# Patient Record
Sex: Male | Born: 1960 | Race: Black or African American | Hispanic: No | Marital: Single | Smoking: Never smoker
Health system: Southern US, Community
[De-identification: ages and names within clinical notes are randomized; demographics above are authoritative.]

## PROBLEM LIST (undated history)

## (undated) DIAGNOSIS — B20 Human immunodeficiency virus [HIV] disease: Secondary | ICD-10-CM

---

## 2004-08-19 ENCOUNTER — Emergency Department (HOSPITAL_COMMUNITY): Admission: EM | Admit: 2004-08-19 | Discharge: 2004-08-19 | Payer: Self-pay | Admitting: Emergency Medicine

## 2004-09-02 ENCOUNTER — Encounter: Admission: RE | Admit: 2004-09-02 | Discharge: 2004-09-02 | Payer: Self-pay | Admitting: Neurosurgery

## 2011-12-25 ENCOUNTER — Emergency Department (HOSPITAL_COMMUNITY): Payer: No Typology Code available for payment source

## 2011-12-25 ENCOUNTER — Emergency Department (HOSPITAL_COMMUNITY)
Admission: EM | Admit: 2011-12-25 | Discharge: 2011-12-25 | Disposition: A | Discharge status: Home / Self Care | Payer: No Typology Code available for payment source | Attending: Emergency Medicine | Admitting: Emergency Medicine

## 2011-12-25 ENCOUNTER — Encounter (HOSPITAL_COMMUNITY): Payer: Self-pay | Admitting: Emergency Medicine

## 2011-12-25 DIAGNOSIS — Y92410 Unspecified street and highway as the place of occurrence of the external cause: Secondary | ICD-10-CM | POA: Insufficient documentation

## 2011-12-25 DIAGNOSIS — M549 Dorsalgia, unspecified: Secondary | ICD-10-CM | POA: Insufficient documentation

## 2011-12-25 DIAGNOSIS — Z21 Asymptomatic human immunodeficiency virus [HIV] infection status: Secondary | ICD-10-CM | POA: Insufficient documentation

## 2011-12-25 DIAGNOSIS — S161XXA Strain of muscle, fascia and tendon at neck level, initial encounter: Secondary | ICD-10-CM

## 2011-12-25 DIAGNOSIS — S139XXA Sprain of joints and ligaments of unspecified parts of neck, initial encounter: Secondary | ICD-10-CM | POA: Insufficient documentation

## 2011-12-25 DIAGNOSIS — Y998 Other external cause status: Secondary | ICD-10-CM | POA: Insufficient documentation

## 2011-12-25 HISTORY — DX: Human immunodeficiency virus (HIV) disease: B20

## 2011-12-25 MED ORDER — CYCLOBENZAPRINE HCL 10 MG PO TABS: 10.0000 mg | ORAL_TABLET | Freq: Three times a day (TID) | ORAL | Status: AC | PRN

## 2011-12-25 MED ORDER — CYCLOBENZAPRINE HCL 10 MG PO TABS
10.0000 mg | ORAL_TABLET | Freq: Once | ORAL | Status: AC
  Administered 2011-12-25: 10 mg via ORAL
  Filled 2011-12-25: qty 1

## 2011-12-25 MED ORDER — HYDROCODONE-ACETAMINOPHEN 5-325 MG PO TABS: 1.0000 | ORAL_TABLET | ORAL | Status: AC | PRN

## 2011-12-25 MED ORDER — ACETAMINOPHEN 325 MG PO TABS
650.0000 mg | ORAL_TABLET | Freq: Once | ORAL | Status: AC
  Administered 2011-12-25: 650 mg via ORAL
  Filled 2011-12-25: qty 2

## 2011-12-25 MED ORDER — HYDROCODONE-ACETAMINOPHEN 5-325 MG PO TABS
1.0000 | ORAL_TABLET | Freq: Once | ORAL | Status: AC
  Administered 2011-12-25: 1 via ORAL
  Filled 2011-12-25: qty 1
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
## 2011-12-31 ENCOUNTER — Emergency Department (HOSPITAL_COMMUNITY)
Admission: EM | Admit: 2011-12-31 | Discharge: 2011-12-31 | Disposition: A | Discharge status: Home / Self Care | Payer: No Typology Code available for payment source | Attending: Emergency Medicine | Admitting: Emergency Medicine

## 2011-12-31 ENCOUNTER — Encounter (HOSPITAL_COMMUNITY): Payer: Self-pay | Admitting: Emergency Medicine

## 2011-12-31 DIAGNOSIS — Z041 Encounter for examination and observation following transport accident: Secondary | ICD-10-CM

## 2011-12-31 DIAGNOSIS — Z0289 Encounter for other administrative examinations: Secondary | ICD-10-CM | POA: Insufficient documentation
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
## 2012-01-06 NOTE — ED Provider Notes (Signed)
I saw and evaluated the patient, reviewed the resident's note and I agree with the findings and plan.   Benny Lennert, MD 01/06/12 [phone]
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
## 2019-06-03 ENCOUNTER — Ambulatory Visit: Payer: Managed Care, Other (non HMO) | Attending: Internal Medicine

## 2019-06-03 DIAGNOSIS — Z23 Encounter for immunization: Secondary | ICD-10-CM

## 2019-06-03 NOTE — Progress Notes (Signed)
   Covid-19 Vaccination Clinic  Name:  Peter Torres    MRN: [id number] DOB: March 03, 1961  06/03/2019  Peter Torres was observed post Covid-19 immunization for 15 minutes without incident. He was provided with Vaccine Information Sheet and instruction to access the V-Safe system.   Peter Torres was instructed to call 911 with any severe reactions post vaccine: Marland Kitchen Difficulty breathing  . Swelling of face and throat  . A fast heartbeat  . A bad rash all over body  . Dizziness and weakness   Immunizations Administered    Name Date Dose VIS Date Route   Pfizer COVID-19 Vaccine 06/03/2019  2:43 PM 0.3 mL 02/25/2019 Intramuscular   Manufacturer: [employer], [employer]   Lot: [id number]   NDC: [address]-0

## 2019-06-28 ENCOUNTER — Ambulatory Visit: Payer: Managed Care, Other (non HMO) | Attending: Internal Medicine

## 2019-06-28 DIAGNOSIS — Z23 Encounter for immunization: Secondary | ICD-10-CM

## 2019-06-28 NOTE — Progress Notes (Signed)
   Covid-19 Vaccination Clinic  Name:  Peter Torres    MRN: [id number] DOB: February 22, 1961  06/28/2019  Peter Torres was observed post Covid-19 immunization for 15 minutes without incident. He was provided with Vaccine Information Sheet and instruction to access the V-Safe system.   Peter Torres was instructed to call 911 with any severe reactions post vaccine: Marland Kitchen Difficulty breathing  . Swelling of face and throat  . A fast heartbeat  . A bad rash all over body  . Dizziness and weakness   Immunizations Administered    Name Date Dose VIS Date Route   Pfizer COVID-19 Vaccine 06/28/2019  3:10 PM 0.3 mL 02/25/2019 Intramuscular   Manufacturer: [employer], [employer]   Lot: [id number]   NDC: [address]-1

## 2020-03-08 ENCOUNTER — Emergency Department (HOSPITAL_COMMUNITY)
Admission: EM | Admit: 2020-03-08 | Discharge: 2020-03-08 | Disposition: A | Discharge status: Home / Self Care | Payer: Medicare Other | Attending: Emergency Medicine | Admitting: Emergency Medicine

## 2020-03-08 DIAGNOSIS — Z79899 Other long term (current) drug therapy: Secondary | ICD-10-CM | POA: Insufficient documentation

## 2020-03-08 DIAGNOSIS — T839XXA Unspecified complication of genitourinary prosthetic device, implant and graft, initial encounter: Secondary | ICD-10-CM

## 2020-03-08 DIAGNOSIS — Y846 Urinary catheterization as the cause of abnormal reaction of the patient, or of later complication, without mention of misadventure at the time of the procedure: Secondary | ICD-10-CM | POA: Diagnosis not present

## 2020-03-08 DIAGNOSIS — T83098A Other mechanical complication of other indwelling urethral catheter, initial encounter: Secondary | ICD-10-CM | POA: Diagnosis not present

## 2020-03-08 DIAGNOSIS — Y828 Other medical devices associated with adverse incidents: Secondary | ICD-10-CM | POA: Insufficient documentation

## 2020-03-08 NOTE — ED Provider Notes (Signed)
TIME SEEN: 3:05 AM  CHIEF COMPLAINT: Foley catheter not draining  HPI: Patient is a 59 year old male with history of HIV currently on hospice who presents to the emergency department with his Foley catheter not draining.  He states that it has not been draining tonight but during transport he thinks that it started draining appropriately he is feeling better.  No abdominal pain.  No fevers or vomiting.  Catheter last exchanged approximately 2 to 3 weeks ago.  ROS: See HPI Constitutional: no fever  Eyes: no drainage  ENT: no runny nose   Cardiovascular:  no chest pain  Resp: no SOB  GI: no vomiting GU: no dysuria Integumentary: no rash  Allergy: no hives  Musculoskeletal: no leg swelling  Neurological: no slurred speech ROS otherwise negative  PAST MEDICAL HISTORY/PAST SURGICAL HISTORY:  Past Medical History:  Diagnosis Date  . HIV (human immunodeficiency virus infection)     MEDICATIONS:  Prior to Admission medications   Medication Sig Start Date End Date Taking? Authorizing Provider  cyclobenzaprine (FLEXERIL) 10 MG tablet Take 1 tablet (10 mg total) by mouth 3 (three) times daily as needed for muscle spasms. 12/25/11   Carleene Cooper, MD  Efavirenz-Emtricitab-Tenofovir (ATRIPLA PO) Take 1 tablet by mouth daily.     [provider]  HYDROcodone-acetaminophen (NORCO/VICODIN) 5-325 MG per tablet Take 1 tablet by mouth every 4 (four) hours as needed for pain. 12/25/11   Carleene Cooper, MD  ibuprofen (ADVIL,MOTRIN) 800 MG tablet Take 800 mg by mouth every 8 (eight) hours as needed. For pain    [provider]  Multiple Vitamin (MULTIVITAMIN WITH MINERALS) TABS Take 1 tablet by mouth daily.    [provider]    ALLERGIES:  Allergies  Allergen Reactions  . Abacavir Other (See Comments)    [Other] [id number] positive;  potential reaction to other medications [Other] [id number] positive;  potential reaction to other medications     SOCIAL HISTORY:   Social History   Tobacco Use  . Smoking status: Never Smoker  . Smokeless tobacco: Not on file  Substance Use Topics  . Alcohol use: Yes    Comment: occasionally    FAMILY HISTORY: No family history on file.  EXAM: BP 93/72 (BP Location: Right Arm)   Pulse (!) 108   Temp 98.2 F (36.8 C) (Oral)   Resp 18   Ht 5\' 8"  (1.727 m)   Wt 68 kg   SpO2 99%   BMI 22.81 kg/m  CONSTITUTIONAL: Alert and oriented and responds appropriately to questions.  Thin.  Chronically ill-appearing.  In no distress. HEAD: Normocephalic EYES: Conjunctivae clear, pupils appear equal, EOM appear intact ENT: normal nose; moist mucous membranes NECK: Supple, normal ROM CARD: Regular and minimally tachycardic; S1 and S2 appreciated; no murmurs, no clicks, no rubs, no gallops RESP: Normal chest excursion without splinting or tachypnea; breath sounds clear and equal bilaterally; no wheezes, no rhonchi, no rales, no hypoxia or respiratory distress, speaking full sentences ABD/GI: Normal bowel sounds; non-distended; soft, non-tender, no rebound, no guarding, no peritoneal signs, no hepatosplenomegaly BACK:  The back appears normal EXT: Normal ROM in all joints; no deformity noted, no edema; no cyanosis SKIN: Normal color for age and race; warm; no rash on exposed skin NEURO: Moves all extremities equally PSYCH: The patient's mood and manner are appropriate.   MEDICAL DECISION MAKING: Patient here with concern that his Foley catheter is not draining appropriately.  States in route with EMS it started draining he is feeling better.  Bladder scan only reveals approximately 46 mL.  Abdominal exam benign.  Given this appears to be functioning appropriately, I feel he is safe to be discharged home.  I do not feel his Foley catheter needs to be exchanged at this time.  No gross blood seen in catheter bag.  No clots.  Patient comfortable with plan.  At this time, I do not feel there is any life-threatening condition  present. I have reviewed, interpreted and discussed all results (EKG, imaging, lab, urine as appropriate) and exam findings with patient/family. I have reviewed nursing notes and appropriate previous records.  I feel the patient is safe to be discharged home without further emergent workup and can continue workup as an outpatient as needed. Discussed usual and customary return precautions. Patient/family verbalize understanding and are comfortable with this plan.  Outpatient follow-up has been provided as needed. All questions have been answered.    Peter Torres was evaluated in Emergency Department on 03/08/2020 for the symptoms described in the history of present illness. He was evaluated in the context of the global COVID-19 pandemic, which necessitated consideration that the patient might be at risk for infection with the SARS-CoV-2 virus that causes COVID-19. Institutional protocols and algorithms that pertain to the evaluation of patients at risk for COVID-19 are in a state of rapid change based on information released by regulatory bodies including the CDC and federal and state organizations. These policies and algorithms were followed during the patient's care in the ED.      Vaeda Westall, Layla Maw, DO 03/08/20 [phone]

## 2020-03-08 NOTE — ED Triage Notes (Signed)
Pt to ER via EMS from home.  PT is a home hospice patient.  Per EMS, hospice nurse called because she was unable to get urinary drainage from the pt's foley catheter.  Pt is a cancer pt and has chronic abdominal pain, but no other complaints at this time.  NADN.

## 2020-03-08 NOTE — ED Notes (Signed)
46 mL of urine noted in bladder.

## 2020-03-08 NOTE — Discharge Instructions (Addendum)
It looks like your Foley catheter is now draining appropriately.  I feel you are safe to be discharged home.

## 2020-04-17 DEATH — deceased
